# Patient Record
Sex: Female | Born: 1990 | Race: Black or African American | Hispanic: No | Marital: Single | Smoking: Never smoker
Health system: Southern US, Community
[De-identification: ages and names within clinical notes are randomized; demographics above are authoritative.]

## PROBLEM LIST (undated history)

## (undated) DIAGNOSIS — Z9109 Other allergy status, other than to drugs and biological substances: Secondary | ICD-10-CM

## (undated) HISTORY — PX: WISDOM TOOTH EXTRACTION: SHX21

---

## 2017-02-10 LAB — HM PAP SMEAR

## 2017-02-16 ENCOUNTER — Ambulatory Visit (HOSPITAL_COMMUNITY)
Admission: EM | Admit: 2017-02-16 | Discharge: 2017-02-16 | Disposition: A | Discharge status: Home / Self Care | Payer: BLUE CROSS/BLUE SHIELD | Attending: Family Medicine | Admitting: Family Medicine

## 2017-02-16 ENCOUNTER — Other Ambulatory Visit: Payer: Self-pay

## 2017-02-16 ENCOUNTER — Encounter (HOSPITAL_COMMUNITY): Payer: Self-pay | Admitting: Emergency Medicine

## 2017-02-16 DIAGNOSIS — G43909 Migraine, unspecified, not intractable, without status migrainosus: Secondary | ICD-10-CM | POA: Insufficient documentation

## 2017-02-16 DIAGNOSIS — R0981 Nasal congestion: Secondary | ICD-10-CM | POA: Diagnosis not present

## 2017-02-16 DIAGNOSIS — J029 Acute pharyngitis, unspecified: Secondary | ICD-10-CM | POA: Insufficient documentation

## 2017-02-16 DIAGNOSIS — J111 Influenza due to unidentified influenza virus with other respiratory manifestations: Secondary | ICD-10-CM | POA: Insufficient documentation

## 2017-02-16 DIAGNOSIS — R6889 Other general symptoms and signs: Secondary | ICD-10-CM | POA: Diagnosis not present

## 2017-02-16 DIAGNOSIS — R05 Cough: Secondary | ICD-10-CM | POA: Insufficient documentation

## 2017-02-16 LAB — POCT RAPID STREP A: STREPTOCOCCUS, GROUP A SCREEN (DIRECT): NEGATIVE

## 2017-02-16 MED ORDER — ACETAMINOPHEN 325 MG PO TABS
ORAL_TABLET | ORAL | Status: AC
  Filled 2017-02-16: qty 2

## 2017-02-16 MED ORDER — ACETAMINOPHEN 325 MG PO TABS
650.0000 mg | ORAL_TABLET | Freq: Once | ORAL | Status: AC
  Administered 2017-02-16: 650 mg via ORAL

## 2017-02-16 MED ORDER — OSELTAMIVIR PHOSPHATE 75 MG PO CAPS: 75.0000 mg | ORAL_CAPSULE | Freq: Two times a day (BID) | ORAL | 0 refills | Status: DC

## 2017-02-16 NOTE — ED Provider Notes (Signed)
  [facility]   [id number] 02/16/17 Arrival Time: 1034   SUBJECTIVE:  Erin Chapman is a 26 y.o. female who presents to the urgent care with complaint of body aches, migraine, productive cough, sore throat and nasal congestion for one day.  History reviewed. No pertinent past medical history. History reviewed. No pertinent family history. Social History   Socioeconomic History  . Marital status: Single    Spouse name: Not on file  . Number of children: Not on file  . Years of education: Not on file  . Highest education level: Not on file  Social Needs  . Financial resource strain: Not on file  . Food insecurity - worry: Not on file  . Food insecurity - inability: Not on file  . Transportation needs - medical: Not on file  . Transportation needs - non-medical: Not on file  Occupational History  . Not on file  Tobacco Use  . Smoking status: Never Smoker  . Smokeless tobacco: Never Used  Substance and Sexual Activity  . Alcohol use: Yes  . Drug use: No  . Sexual activity: Not on file  Other Topics Concern  . Not on file  Social History Narrative  . Not on file   No outpatient medications have been marked as taking for the 02/16/17 encounter [facility](Hospital Encounter).   No Known Allergies    ROS: As per HPI, remainder of ROS negative.   OBJECTIVE:   Vitals:   02/16/17 1101  BP: (!) 114/59  Pulse: (!) 112  Temp: (!) 102.6 F (39.2 C)  TempSrc: Oral  SpO2: 95%     General appearance: alert; no distress Eyes: PERRL; EOMI; conjunctiva normal HENT: normocephalic; atraumatic; TMs normal, canal normal, external ears normal without trauma; nasal mucosa normal; oral mucosa shows swollen, red left tonsil Neck: supple Lungs: clear to auscultation bilaterally Heart: regular rate and rhythm Back: no CVA tenderness Extremities: no cyanosis or edema; symmetrical with no gross deformities Skin: warm and dry Neurologic: normal gait; grossly  normal Psychological: alert and cooperative; normal mood and affect      Labs:  Results for orders placed or performed during the hospital encounter of 02/16/17  POCT rapid strep A [facility])  Result Value Ref Range   Streptococcus, Group A Screen (Direct) NEGATIVE NEGATIVE    Labs Reviewed  CULTURE, GROUP A STREP [facility])  POCT RAPID STREP A    No results found.     ASSESSMENT & PLAN:  1. Flu-like symptoms     Meds ordered this encounter  Medications  . acetaminophen (TYLENOL) tablet 650 mg  . oseltamivir (TAMIFLU) 75 MG capsule    Sig: Take 1 capsule (75 mg total) by mouth every 12 (twelve) hours.    Dispense:  10 capsule    Refill:  0    Reviewed expectations re: course of current medical issues. Questions answered. Outlined signs and symptoms indicating need for more acute intervention. Patient verbalized understanding. After Visit Summary given.    Procedures:      Elvina SidleLauenstein, Maddelynn Moosman, MD 02/16/17 1131

## 2017-02-16 NOTE — ED Triage Notes (Signed)
Pt reports body aches, migraine, productive cough, sore throat and nasal congestion for two days.

## 2017-02-19 LAB — CULTURE, GROUP A STREP (THRC)

## 2017-07-19 ENCOUNTER — Ambulatory Visit (HOSPITAL_COMMUNITY)
Admission: EM | Admit: 2017-07-19 | Discharge: 2017-07-19 | Disposition: A | Discharge status: Home / Self Care | Payer: BLUE CROSS/BLUE SHIELD | Attending: Family Medicine | Admitting: Family Medicine

## 2017-07-19 ENCOUNTER — Encounter (HOSPITAL_COMMUNITY): Payer: Self-pay | Admitting: Emergency Medicine

## 2017-07-19 DIAGNOSIS — B349 Viral infection, unspecified: Secondary | ICD-10-CM

## 2017-07-19 DIAGNOSIS — R05 Cough: Secondary | ICD-10-CM

## 2017-07-19 DIAGNOSIS — R0981 Nasal congestion: Secondary | ICD-10-CM

## 2017-07-19 MED ORDER — METHYLPREDNISOLONE SODIUM SUCC 125 MG IJ SOLR
125.0000 mg | Freq: Once | INTRAMUSCULAR | Status: AC
  Administered 2017-07-19: 125 mg via INTRAMUSCULAR

## 2017-07-19 MED ORDER — IPRATROPIUM BROMIDE 0.06 % NA SOLN: 2.0000 | Freq: Four times a day (QID) | NASAL | 0 refills | Status: DC

## 2017-07-19 MED ORDER — BENZONATATE 100 MG PO CAPS: 100.0000 mg | ORAL_CAPSULE | Freq: Three times a day (TID) | ORAL | 0 refills | Status: DC

## 2017-07-19 MED ORDER — IPRATROPIUM-ALBUTEROL 0.5-2.5 (3) MG/3ML IN SOLN
3.0000 mL | Freq: Once | RESPIRATORY_TRACT | Status: AC
  Administered 2017-07-19: 3 mL via RESPIRATORY_TRACT

## 2017-07-19 MED ORDER — IPRATROPIUM-ALBUTEROL 0.5-2.5 (3) MG/3ML IN SOLN
RESPIRATORY_TRACT | Status: AC
  Filled 2017-07-19: qty 3

## 2017-07-19 MED ORDER — METHYLPREDNISOLONE SODIUM SUCC 125 MG IJ SOLR
INTRAMUSCULAR | Status: AC
  Filled 2017-07-19: qty 2

## 2017-07-19 MED ORDER — FLUTICASONE PROPIONATE 50 MCG/ACT NA SUSP: 2.0000 | Freq: Every day | NASAL | 0 refills | Status: DC

## 2017-07-19 NOTE — ED Triage Notes (Signed)
PT reports sore throat, SOB, chest tightness, and cough since Saturday. PT has been using inhaler without relief.

## 2017-07-19 NOTE — ED Provider Notes (Signed)
[facility]    CSN: [id number] Arrival date & time: 07/19/17  1359     History   Chief Complaint Chief Complaint  Patient presents with  . URI    HPI Erin Chapman is a 27 y.o. female.   27 year old female comes in with 4-day history of URI symptoms.  She has had sore throat, rhinorrhea, nasal congestion, productive cough.  States cough is worse with moving and talking.  Denies fever, but has felt chills and night sweats.  Denies hemoptysis.  States that she does not have a history of asthma, but has needed inhalers during URI symptoms.  She has been using her albuterol without relief.  OTC cold medication without relief.  Never smoker.     History reviewed. No pertinent past medical history.  There are no active problems to display for this patient.   History reviewed. No pertinent surgical history.  OB History   None      Home Medications    Prior to Admission medications   Medication Sig Start Date End Date Taking? Authorizing Provider  benzonatate (TESSALON) 100 MG capsule Take 1 capsule (100 mg total) by mouth every 8 (eight) hours. 07/19/17   Cathie Hoops, Amy V, PA-C  fluticasone (FLONASE) 50 MCG/ACT nasal spray Place 2 sprays into both nostrils daily. 07/19/17   Cathie Hoops, Amy V, PA-C  ipratropium (ATROVENT) 0.06 % nasal spray Place 2 sprays into both nostrils 4 (four) times daily. 07/19/17   Belinda Fisher, PA-C    Family History No family history on file.  Social History Social History   Tobacco Use  . Smoking status: Never Smoker  . Smokeless tobacco: Never Used  Substance Use Topics  . Alcohol use: Yes  . Drug use: No     Allergies   Patient has no known allergies.   Review of Systems Review of Systems  Reason unable to perform ROS: See HPI as above.     Physical Exam Triage Vital Signs ED Triage Vitals [07/19/17 1449]  Enc Vitals Group     BP 136/85     Pulse Rate 83     Resp 16     Temp 98.9 F (37.2 C)     Temp Source Temporal   SpO2 100 %     Weight 165 lb (74.8 kg)     Height      Head Circumference      Peak Flow      Pain Score 8     Pain Loc      Pain Edu?      Excl. in GC?    No data found.  Updated Vital Signs BP 136/85   Pulse 83   Temp 98.9 F (37.2 C) (Temporal)   Resp 16   Wt 165 lb (74.8 kg)   LMP 07/17/2017   SpO2 100%   Physical Exam  Constitutional: She is oriented to person, place, and time. She appears well-developed and well-nourished. No distress.  HENT:  Head: Normocephalic and atraumatic.  Right Ear: Tympanic membrane, external ear and ear canal normal. Tympanic membrane is not erythematous and not bulging.  Left Ear: Tympanic membrane, external ear and ear canal normal. Tympanic membrane is not erythematous and not bulging.  Nose: Rhinorrhea present. Right sinus exhibits no maxillary sinus tenderness and no frontal sinus tenderness. Left sinus exhibits no maxillary sinus tenderness and no frontal sinus tenderness.  Mouth/Throat: Uvula is midline, oropharynx is clear and moist and mucous membranes are normal.  Eyes:  Pupils are equal, round, and reactive to light. Conjunctivae are normal.  Neck: Normal range of motion. Neck supple.  Cardiovascular: Normal rate, regular rhythm and normal heart sounds. Exam reveals no gallop and no friction rub.  No murmur heard. Pulmonary/Chest: Effort normal and breath sounds normal. She has no decreased breath sounds. She has no wheezes. She has no rhonchi. She has no rales.  Lymphadenopathy:    She has no cervical adenopathy.  Neurological: She is alert and oriented to person, place, and time.  Skin: Skin is warm and dry.  Psychiatric: She has a normal mood and affect. Her behavior is normal. Judgment normal.     UC Treatments / Results  Labs (all labs ordered are listed, but only abnormal results are displayed) Labs Reviewed - No data to display  EKG None  Radiology No results found.  Procedures Procedures (including critical care  time)  Medications Ordered in UC Medications  ipratropium-albuterol (DUONEB) 0.5-2.5 (3) MG/3ML nebulizer solution 3 mL (3 mLs Nebulization Given 07/19/17 1657)  methylPREDNISolone sodium succinate (SOLU-MEDROL) 125 mg/2 mL injection 125 mg (125 mg Intramuscular Given 07/19/17 1718)    Initial Impression / Assessment and Plan / UC Course  I have reviewed the triage vital signs and the nursing notes.  Pertinent labs & imaging results that were available during my care of the patient were reviewed by me and considered in my medical decision making (see chart for details).    Patient with improved symptoms after DuoNeb, lungs continue to be clear to auscultation bilaterally.  Will provide prednisone injection in office today.  Other symptomatic treatment discussed.  Can continue albuterol as needed.  Return precautions given.  Patient expresses understanding and agrees to plan.  Final Clinical Impressions(s) / UC Diagnoses   Final diagnoses:  Viral illness    ED Prescriptions    Medication Sig Dispense Auth. Provider   benzonatate (TESSALON) 100 MG capsule Take 1 capsule (100 mg total) by mouth every 8 (eight) hours. 21 capsule Yu, Amy V, PA-C   fluticasone (FLONASE) 50 MCG/ACT nasal spray Place 2 sprays into both nostrils daily. 1 g Yu, Amy V, PA-C   ipratropium (ATROVENT) 0.06 % nasal spray Place 2 sprays into both nostrils 4 (four) times daily. 15 mL Threasa Alpha, [place] 07/19/17 1807

## 2017-07-19 NOTE — Discharge Instructions (Signed)
Prednisone injection in office today. Tessalon for cough. Continue albuterol as needed for shortness of breathing/wheezing.  Start flonase, atrovent nasal spray for nasal congestion/drainage. You can use over the counter nasal saline rinse such as neti pot for nasal congestion. Keep hydrated, your urine should be clear to pale yellow in color. Tylenol/motrin for fever and pain. Monitor for any worsening of symptoms, chest pain, shortness of breath, wheezing, swelling of the throat, follow up for reevaluation.   For sore throat/cough try using a honey-based tea. Use 3 teaspoons of honey with juice squeezed from half lemon. Place shaved pieces of ginger into 1/2-1 cup of water and warm over stove top. Then mix the ingredients and repeat every 4 hours as needed.

## 2017-09-12 ENCOUNTER — Ambulatory Visit (HOSPITAL_COMMUNITY)
Admission: EM | Admit: 2017-09-12 | Discharge: 2017-09-12 | Disposition: A | Discharge status: Home / Self Care | Payer: Self-pay | Attending: Family Medicine | Admitting: Family Medicine

## 2017-09-12 ENCOUNTER — Encounter (HOSPITAL_COMMUNITY): Payer: Self-pay

## 2017-09-12 DIAGNOSIS — N898 Other specified noninflammatory disorders of vagina: Secondary | ICD-10-CM | POA: Insufficient documentation

## 2017-09-12 DIAGNOSIS — Z79899 Other long term (current) drug therapy: Secondary | ICD-10-CM | POA: Insufficient documentation

## 2017-09-12 LAB — POCT URINALYSIS DIP (DEVICE)
BILIRUBIN URINE: NEGATIVE
Glucose, UA: NEGATIVE mg/dL
Hgb urine dipstick: NEGATIVE
KETONES UR: NEGATIVE mg/dL
Leukocytes, UA: NEGATIVE
NITRITE: NEGATIVE
Protein, ur: NEGATIVE mg/dL
Specific Gravity, Urine: 1.015 (ref 1.005–1.030)
Urobilinogen, UA: 0.2 mg/dL (ref 0.0–1.0)
pH: 6 (ref 5.0–8.0)

## 2017-09-12 MED ORDER — METRONIDAZOLE 500 MG PO TABS: 500.0000 mg | ORAL_TABLET | Freq: Two times a day (BID) | ORAL | 0 refills | Status: AC

## 2017-09-12 MED ORDER — FLUCONAZOLE 150 MG PO TABS: 150.0000 mg | ORAL_TABLET | Freq: Once | ORAL | 0 refills | Status: AC

## 2017-09-12 NOTE — ED Provider Notes (Signed)
[facility]    CSN: [id number] Arrival date & time: 09/12/17  1030     History   Chief Complaint Chief Complaint  Patient presents with  . Vaginal Discharge    HPI Erin Chapman is a 27 y.o. female no significant past medical history presenting today for evaluation of vaginal discharge.  Patient has had vaginal discharge for the past 3 to 4 weeks.  She notes that the discharge is thin and whitish-gray discolored.  Occasional odor.  Is concerned about bacterial vaginosis, denies history of this, but believes this is what she has based off her symptoms.  She states that it may she believes she had a yeast infection and treated this with over-the-counter treatments.  She notes some mild itching and irritation especially when she is wearing tight clothing.  Last menstrual period started Wednesday.  Patient also notes that she has a history of interstitial cystitis, and notes that she did have some mild dysuria, but nothing persistent.  Patient declines concern for STDs.  HPI  History reviewed. No pertinent past medical history.  There are no active problems to display for this patient.   History reviewed. No pertinent surgical history.  OB History   None      Home Medications    Prior to Admission medications   Medication Sig Start Date End Date Taking? Authorizing Provider  benzonatate (TESSALON) 100 MG capsule Take 1 capsule (100 mg total) by mouth every 8 (eight) hours. 07/19/17   Cathie HoopsYu, Amy V, PA-C  fluconazole (DIFLUCAN) 150 MG tablet Take 1 tablet (150 mg total) by mouth once for 1 dose. 09/12/17 09/12/17  Natalyia Innes C, PA-C  fluticasone (FLONASE) 50 MCG/ACT nasal spray Place 2 sprays into both nostrils daily. 07/19/17   Cathie HoopsYu, Amy V, PA-C  ipratropium (ATROVENT) 0.06 % nasal spray Place 2 sprays into both nostrils 4 (four) times daily. 07/19/17   Cathie HoopsYu, Amy V, PA-C  metroNIDAZOLE (FLAGYL) 500 MG tablet Take 1 tablet (500 mg total) by mouth 2 (two) times daily for 7  days. 09/12/17 09/19/17  Deigo Alonso, Junius CreamerHallie C, PA-C    Family History Family History  Problem Relation Age of Onset  . Healthy Mother   . Healthy Father     Social History Social History   Tobacco Use  . Smoking status: Never Smoker  . Smokeless tobacco: Never Used  Substance Use Topics  . Alcohol use: Yes  . Drug use: No     Allergies   Patient has no known allergies.   Review of Systems Review of Systems  Constitutional: Negative for fever.  Respiratory: Negative for shortness of breath.   Cardiovascular: Negative for chest pain.  Gastrointestinal: Negative for abdominal pain, diarrhea, nausea and vomiting.  Genitourinary: Positive for dysuria and vaginal discharge. Negative for flank pain, frequency, genital sores, hematuria, menstrual problem, urgency, vaginal bleeding and vaginal pain.  Musculoskeletal: Negative for back pain.  Skin: Negative for rash.  Neurological: Negative for dizziness, light-headedness and headaches.     Physical Exam Triage Vital Signs ED Triage Vitals  Enc Vitals Group     BP 09/12/17 1049 121/78     Pulse Rate 09/12/17 1049 68     Resp 09/12/17 1049 16     Temp 09/12/17 1049 98.4 F (36.9 C)     Temp Source 09/12/17 1049 Oral     SpO2 09/12/17 1049 100 %     Weight --      Height --      Head  Circumference --      Peak Flow --      Pain Score 09/12/17 1053 0     Pain Loc --      Pain Edu? --      Excl. in GC? --    No data found.  Updated Vital Signs BP 121/78 (BP Location: Right Arm)   Pulse 68   Temp 98.4 F (36.9 C) (Oral)   Resp 16   LMP 09/12/2017   SpO2 100%   Visual Acuity Right Eye Distance:   Left Eye Distance:   Bilateral Distance:    Right Eye Near:   Left Eye Near:    Bilateral Near:     Physical Exam  Constitutional: She is oriented to person, place, and time. She appears well-developed and well-nourished.  No acute distress  HENT:  Head: Normocephalic and atraumatic.  Nose: Nose normal.  Eyes:  Conjunctivae are normal.  Neck: Neck supple.  Cardiovascular: Normal rate.  Pulmonary/Chest: Effort normal. No respiratory distress.  Abdominal: She exhibits no distension. There is tenderness.  Nontender light deep palpation throughout all 4 quadrants and epigastrium  Genitourinary:  Genitourinary Comments: Normal female external genitalia, mild amount of bright red blood in the vagina, no cervical erythema  Musculoskeletal: Normal range of motion.  Neurological: She is alert and oriented to person, place, and time.  Skin: Skin is warm and dry.  Psychiatric: She has a normal mood and affect.  Nursing note and vitals reviewed.    UC Treatments / Results  Labs (all labs ordered are listed, but only abnormal results are displayed) Labs Reviewed  POCT URINALYSIS DIP (DEVICE)  CERVICOVAGINAL ANCILLARY ONLY    EKG None  Radiology No results found.  Procedures Procedures (including critical care time)  Medications Ordered in UC Medications - No data to display  Initial Impression / Assessment and Plan / UC Course  I have reviewed the triage vital signs and the nursing notes.  Pertinent labs & imaging results that were available during my care of the patient were reviewed by me and considered in my medical decision making (see chart for details).     Patient with vaginal discharge, vaginal swab obtained, will go ahead and initiate treatment for yeast and BV given symptoms.  We will also check for STDs.  Will defer empiric treatment today for STDs.  Will call patient with results and alter treatment as needed.Discussed strict return precautions. Patient verbalized understanding and is agreeable with plan.  Final Clinical Impressions(s) / UC Diagnoses   Final diagnoses:  Vaginal discharge     Discharge Instructions     Please begin taking metronidazole twice daily for the next week.  This will treat bacterial vaginosis.  Please do not drink alcohol while taking this  medicine.  We are testing you for Gonorrhea, Chlamydia, Trichomonas, Yeast and Bacterial Vaginosis. We will call you if anything is positive and let you know if you require any further treatment. Please inform partners of any positive results.   Please return if symptoms not improving with treatment, development of fever, nausea, vomiting, abdominal pain.    ED Prescriptions    Medication Sig Dispense Auth. Provider   metroNIDAZOLE (FLAGYL) 500 MG tablet Take 1 tablet (500 mg total) by mouth 2 (two) times daily for 7 days. 14 tablet Margalit Leece C, PA-C   fluconazole (DIFLUCAN) 150 MG tablet Take 1 tablet (150 mg total) by mouth once for 1 dose. 2 tablet Lyn Deemer, [place] C, PA-C  Controlled Substance Prescriptions [place] Controlled Substance Registry consulted? Not Applicable   Lew Dawes, [place] 09/12/17 1222

## 2017-09-12 NOTE — ED Triage Notes (Signed)
Pt presents with vaginal  discharge 

## 2017-09-12 NOTE — Discharge Instructions (Signed)
Please begin taking metronidazole twice daily for the next week.  This will treat bacterial vaginosis.  Please do not drink alcohol while taking this medicine.  We are testing you for Gonorrhea, Chlamydia, Trichomonas, Yeast and Bacterial Vaginosis. We will call you if anything is positive and let you know if you require any further treatment. Please inform partners of any positive results.   Please return if symptoms not improving with treatment, development of fever, nausea, vomiting, abdominal pain.

## 2017-09-13 LAB — CERVICOVAGINAL ANCILLARY ONLY
Bacterial vaginitis: NEGATIVE
Candida vaginitis: NEGATIVE
Chlamydia: NEGATIVE
NEISSERIA GONORRHEA: NEGATIVE
TRICH (WINDOWPATH): NEGATIVE

## 2018-01-15 ENCOUNTER — Other Ambulatory Visit: Payer: Self-pay

## 2018-01-15 ENCOUNTER — Encounter (HOSPITAL_COMMUNITY): Payer: Self-pay | Admitting: *Deleted

## 2018-01-15 ENCOUNTER — Ambulatory Visit (HOSPITAL_COMMUNITY)
Admission: EM | Admit: 2018-01-15 | Discharge: 2018-01-15 | Disposition: A | Discharge status: Home / Self Care | Payer: Self-pay | Attending: Family Medicine | Admitting: Family Medicine

## 2018-01-15 DIAGNOSIS — Z9109 Other allergy status, other than to drugs and biological substances: Secondary | ICD-10-CM

## 2018-01-15 HISTORY — DX: Other allergy status, other than to drugs and biological substances: Z91.09

## 2018-01-15 LAB — POCT URINALYSIS DIP (DEVICE)
BILIRUBIN URINE: NEGATIVE
Glucose, UA: NEGATIVE mg/dL
Ketones, ur: NEGATIVE mg/dL
Leukocytes, UA: NEGATIVE
Nitrite: NEGATIVE
PH: 5.5 (ref 5.0–8.0)
PROTEIN: NEGATIVE mg/dL
Specific Gravity, Urine: >=1.03 (ref 1.005–1.030)
Urobilinogen, UA: 0.2 mg/dL (ref 0.0–1.0)

## 2018-01-15 LAB — POCT I-STAT, CHEM 8
BUN: 5 mg/dL — AB (ref 6–20)
CALCIUM ION: 1.11 mmol/L — AB (ref 1.15–1.40)
CREATININE: 0.8 mg/dL (ref 0.44–1.00)
Chloride: 105 mmol/L (ref 98–111)
GLUCOSE: 93 mg/dL (ref 70–99)
HEMATOCRIT: 32 % — AB (ref 36.0–46.0)
Hemoglobin: 10.9 g/dL — ABNORMAL LOW (ref 12.0–15.0)
Potassium: 4 mmol/L (ref 3.5–5.1)
Sodium: 139 mmol/L (ref 135–145)
TCO2: 25 mmol/L (ref 22–32)

## 2018-01-15 MED ORDER — METHYLPREDNISOLONE SODIUM SUCC 125 MG IJ SOLR
80.0000 mg | Freq: Once | INTRAMUSCULAR | Status: AC
  Administered 2018-01-15: 80 mg via INTRAMUSCULAR

## 2018-01-15 MED ORDER — METHYLPREDNISOLONE SODIUM SUCC 125 MG IJ SOLR
INTRAMUSCULAR | Status: AC
  Filled 2018-01-15: qty 2

## 2018-01-15 NOTE — ED Triage Notes (Signed)
Reports waking yesterday morning with bilat hand swelling; took Benadryl.  Today feels her entire body is swollen.  Denies rash.

## 2018-01-15 NOTE — Discharge Instructions (Signed)
Continue allergy medicine Avoid salt See your PCP in follow up

## 2018-01-15 NOTE — ED Provider Notes (Signed)
[facility]    CSN: [id number] Arrival date & time: 01/15/18  1021     History   Chief Complaint Chief Complaint  Patient presents with  . Edema    HPI Erin Chapman is a 27 y.o. female.   HPI  Patient is here for body swelling.  Started yesterday.  She took Benadryl.  This morning it was worse.  She came into be evaluated. She has multiple environmental allergies.  She states she is allergic to hay, grass, molds, "everything outside".  She takes Zyrtec daily and Flonase.  This usually controls her symptoms.  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  She does not remember anything out of the usual.  No other allergies, foods, medicines, supplements. She does not have a joint pain.  She states it feels more like a "puffiness" "stiffness".  It is moderately uncomfortable.  It affects her ability to grip. She denies eating anything different or a salt load She does not have any hormonal problems, irregular periods, thyroid disease, kidney disease She states she feels like she got dehydrated a couple days before her edema, drink a lot of fluid to catch up, and then was puffy the next day No shortness of breath No abdominal distention No chest pain or pressure No underlying medical problems except for her allergies  Past Medical History:  Diagnosis Date  . Environmental allergies     There are no active problems to display for this patient.   History reviewed. No pertinent surgical history.  OB History   None      Home Medications    Prior to Admission medications   Not on File    Family History Family History  Problem Relation Age of Onset  . Healthy Mother   . Healthy Father     Social History Social History   Tobacco Use  . Smoking status: Never Smoker  . Smokeless tobacco: Never Used  Substance Use Topics  . Alcohol use: Yes    Comment: occasionally  . Drug use: No     Allergies   Patient has  no known allergies.   Review of Systems Review of Systems  Constitutional: Negative for chills and fever.  HENT: Negative for ear pain and sore throat.   Eyes: Negative for pain and visual disturbance.  Respiratory: Negative for cough and shortness of breath.   Cardiovascular: Positive for leg swelling. Negative for chest pain and palpitations.       Hands ankles feet all feel "puffy"  Gastrointestinal: Negative for abdominal pain and vomiting.  Genitourinary: Negative for dysuria and hematuria.  Musculoskeletal: Negative for arthralgias and back pain.  Skin: Negative for color change and rash.  Neurological: Negative for seizures and syncope.  All other systems reviewed and are negative.    Physical Exam Triage Vital Signs ED Triage Vitals  Enc Vitals Group     BP 01/15/18 1127 (!) 142/88     Pulse Rate 01/15/18 1127 62     Resp 01/15/18 1127 16     Temp 01/15/18 1127 98.1 F (36.7 C)     Temp Source 01/15/18 1127 Oral     SpO2 01/15/18 1127 100 %   No data found.  Updated Vital Signs BP (!) 142/88   Pulse 62   Temp 98.1 F (36.7 C) (Oral)   Resp 16   LMP 01/11/2018 (Exact Date)   SpO2 100%    Physical Exam  Constitutional:  She appears well-developed and well-nourished. No distress.  HENT:  Head: Normocephalic and atraumatic.  Mouth/Throat: Oropharynx is clear and moist.  Eyes: Pupils are equal, round, and reactive to light. Conjunctivae are normal.  Neck: Normal range of motion. Neck supple. No thyromegaly present.  Cardiovascular: Normal rate, regular rhythm and normal heart sounds.  Pulmonary/Chest: Effort normal and breath sounds normal. No respiratory distress.  Abdominal: Soft. She exhibits no distension. There is no tenderness.  No HSM  Musculoskeletal: Normal range of motion. She exhibits edema.  Trace pitting edema to the ankle  Neurological: She is alert.  Skin: Skin is warm and dry. No rash noted.  Psychiatric: She has a normal mood and affect.  Her behavior is normal.     UC Treatments / Results  Labs (all labs ordered are listed, but only abnormal results are displayed) Labs Reviewed  POCT I-STAT, CHEM 8 - Abnormal; Notable for the following components:      Result Value   BUN 5 (*)    Calcium, Ion 1.11 (*)    Hemoglobin 10.9 (*)    HCT 32.0 (*)    All other components within normal limits  POCT URINALYSIS DIP (DEVICE) - Abnormal; Notable for the following components:   Hgb urine dipstick LARGE (*)    All other components within normal limits    EKG None  Radiology No results found.  Procedures Procedures (including critical care time)  Medications Ordered in UC Medications  methylPREDNISolone sodium succinate (SOLU-MEDROL) 125 mg/2 mL injection 80 mg (80 mg Intramuscular Given 01/15/18 1303)    Initial Impression / Assessment and Plan / UC Course  I have reviewed the triage vital signs and the nursing notes.  Pertinent labs & imaging results that were available during my care of the patient were reviewed by me and considered in my medical decision making (see chart for details).     I reviewed with the patient and her friend there are multiple causes of edema.  Can be inflammatory.  Could be allergic.  Could be from metabolic disease.  Could be from salt or dietary intake.  I told her that we are going to treat her for allergies since it seems most likely.  She needs follow-up with the PCP if she needs additional testing. Final Clinical Impressions(s) / UC Diagnoses   Final diagnoses:  Multiple environmental allergies     Discharge Instructions     Continue allergy medicine Avoid salt See your PCP in follow up    ED Prescriptions    None     Controlled Substance Prescriptions [place] Controlled Substance Registry consulted? Not Applicable   Eustace Moore, MD 01/15/18 [phone]

## 2018-10-16 ENCOUNTER — Encounter (HOSPITAL_COMMUNITY): Payer: Self-pay | Admitting: Emergency Medicine

## 2018-10-16 ENCOUNTER — Emergency Department (HOSPITAL_COMMUNITY): Payer: No Typology Code available for payment source

## 2018-10-16 ENCOUNTER — Other Ambulatory Visit: Payer: Self-pay

## 2018-10-16 ENCOUNTER — Emergency Department (HOSPITAL_COMMUNITY)
Admission: EM | Admit: 2018-10-16 | Discharge: 2018-10-16 | Disposition: A | Discharge status: Home / Self Care | Payer: No Typology Code available for payment source | Attending: Emergency Medicine | Admitting: Emergency Medicine

## 2018-10-16 DIAGNOSIS — Y998 Other external cause status: Secondary | ICD-10-CM | POA: Insufficient documentation

## 2018-10-16 DIAGNOSIS — W2210XA Striking against or struck by unspecified automobile airbag, initial encounter: Secondary | ICD-10-CM | POA: Diagnosis not present

## 2018-10-16 DIAGNOSIS — R51 Headache: Secondary | ICD-10-CM | POA: Diagnosis present

## 2018-10-16 DIAGNOSIS — Y92413 State road as the place of occurrence of the external cause: Secondary | ICD-10-CM | POA: Diagnosis not present

## 2018-10-16 DIAGNOSIS — R200 Anesthesia of skin: Secondary | ICD-10-CM | POA: Insufficient documentation

## 2018-10-16 DIAGNOSIS — M546 Pain in thoracic spine: Secondary | ICD-10-CM | POA: Diagnosis not present

## 2018-10-16 DIAGNOSIS — Y9389 Activity, other specified: Secondary | ICD-10-CM | POA: Diagnosis not present

## 2018-10-16 MED ORDER — BACLOFEN 10 MG PO TABS: 10.0000 mg | ORAL_TABLET | Freq: Three times a day (TID) | ORAL | 0 refills | Status: DC

## 2018-10-16 MED ORDER — MELOXICAM 15 MG PO TABS: 15.0000 mg | ORAL_TABLET | Freq: Every day | ORAL | 0 refills | Status: DC
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
## 2018-10-16 NOTE — ED Notes (Signed)
Patient transported to CT 
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
## 2018-10-16 NOTE — Discharge Instructions (Addendum)

## 2019-04-24 ENCOUNTER — Ambulatory Visit: Payer: Self-pay | Admitting: Internal Medicine

## 2019-05-21 ENCOUNTER — Ambulatory Visit: Payer: Self-pay | Admitting: Internal Medicine

## 2019-08-07 ENCOUNTER — Ambulatory Visit: Payer: Self-pay | Admitting: Internal Medicine
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
## 2022-03-09 ENCOUNTER — Other Ambulatory Visit (HOSPITAL_COMMUNITY): Payer: Self-pay

## 2022-05-20 ENCOUNTER — Ambulatory Visit: Payer: 59 | Admitting: Family

## 2022-05-20 ENCOUNTER — Other Ambulatory Visit (HOSPITAL_COMMUNITY): Payer: Self-pay

## 2022-05-20 ENCOUNTER — Encounter: Payer: Self-pay | Admitting: Family

## 2022-05-20 VITALS — BP 128/79 | HR 79 | Temp 97.8°F | Ht 64.0 in | Wt 164.1 lb

## 2022-05-20 DIAGNOSIS — R519 Headache, unspecified: Secondary | ICD-10-CM

## 2022-05-20 DIAGNOSIS — F411 Generalized anxiety disorder: Secondary | ICD-10-CM

## 2022-05-20 DIAGNOSIS — K219 Gastro-esophageal reflux disease without esophagitis: Secondary | ICD-10-CM | POA: Diagnosis not present

## 2022-05-20 DIAGNOSIS — N3010 Interstitial cystitis (chronic) without hematuria: Secondary | ICD-10-CM | POA: Diagnosis not present

## 2022-05-20 MED ORDER — AMITRIPTYLINE HCL 25 MG PO TABS
25.0000 mg | ORAL_TABLET | Freq: Every day | ORAL | 0 refills | Status: DC
  Filled 2022-07-05: qty 30, 30d supply, fill #0

## 2022-05-20 MED ORDER — VENLAFAXINE HCL ER 37.5 MG PO CP24: 37.5000 mg | ORAL_CAPSULE | Freq: Every day | ORAL | 1 refills | Status: DC

## 2022-05-20 NOTE — Progress Notes (Signed)
+  New Patient Office Visit  Subjective:  Patient ID: Erin Chapman, female    DOB: 04/18/1990  Age: 32 y.o. MRN: [id number]  CC:  Chief Complaint  Patient presents with   New Patient (Initial Visit)   Migraine    Pt c/o migraines, Present for a couple of months and has been getting worst. Has tried advil, goody powder which does not help.   Gastroesophageal Reflux    Pt c/o GERD for a year, Has tried Tums which does not help but did in the past.    Anxiety    Amitriptyline in the past which did help sx.    HPI Raelinn Giampa presents for establishing care today.  GERD:  c/o sour stomach, epigastric pain, belching, feels like she almost regurgitates. She has been taking Tums   Anxiety:  over last year has worsened; pt working a new job, recently moved back to the area. Denies any depression, no hx of therapy or any medications.   Persistent headaches/pressure:  also increased over the last year. No hx of any therapy or medications in past, was taking Amitriptyline for IC but stopped a little over a year ago as she was doing better with her sx.  Interstitial cystitis:  had been taking Elavil in past which controlled her sx, but stopped over a year ago as she was doing better, now starting to have sx again, along with increased stress which usually brings on her sx.  Assessment & Plan:  Interstitial cystitis Assessment & Plan: chronic used to take Elavil, stopped over a yr ago, sx were better now having more stress which is aggravating sx again wants to restart Elavil sending 25mg  qhs f/u 76mos or prn  Orders: -     Amitriptyline HCl; Take 1 tablet (25 mg total) by mouth at bedtime.  Dispense: 90 tablet; Refill: 0  Generalized anxiety disorder Assessment & Plan: New moved back recently, working new job discussed meds and therapy sending therapy referral, advised pt to call & sch appt sending Effexor 37.5mg  may also help with her headaches, advised on use & SE f/u 1  month  Orders: -     Venlafaxine HCl ER; Take 1 capsule (37.5 mg total) by mouth daily with breakfast.  Dispense: 30 capsule; Refill: 1  Gastroesophageal reflux disease without esophagitis Assessment & Plan: New Advised on low acid diet ok to continue Tums as needed can try OTC generic Pepcid qd reduce anxiety as much as possible f/u prn   Persistent headaches- most likely d/t increased stress/anxiety, starting Effexor which hopefully will help HA & anxiety. Advised pt to try Excedrin migraine OTC, avoid using ASA, Ibuprofen/Advil qd.F/U in 1 month.  Subjective:    Outpatient Medications Prior to Visit  Medication Sig Dispense Refill   baclofen (LIORESAL) 10 MG tablet Take 1 tablet (10 mg total) by mouth 3 (three) times daily. 30 each 0   meloxicam (MOBIC) 15 MG tablet Take 1 tablet (15 mg total) by mouth daily. Take 1 daily with food. 10 tablet 0   No facility-administered medications prior to visit.   Past Medical History:  Diagnosis Date   Environmental allergies    Past Surgical History:  Procedure Laterality Date   WISDOM TOOTH EXTRACTION      Objective:   Today's Vitals: BP 128/79 (BP Location: Left Arm, Patient Position: Sitting, Cuff Size: Large)   Pulse 79   Temp 97.8 F (36.6 C) (Temporal)   Ht 5\' 4"  (1.626 m)   Wt  164 lb 2 oz (74.4 kg)   LMP 05/12/2022 (Approximate)   SpO2 97%   BMI 28.17 kg/m   Physical Exam Vitals and nursing note reviewed.  Constitutional:      Appearance: Normal appearance.  Cardiovascular:     Rate and Rhythm: Normal rate and regular rhythm.  Pulmonary:     Effort: Pulmonary effort is normal.     Breath sounds: Normal breath sounds.  Musculoskeletal:        General: Normal range of motion.  Skin:    General: Skin is warm and dry.  Neurological:     Mental Status: She is alert.  Psychiatric:        Mood and Affect: Mood normal.        Behavior: Behavior normal.    Meds ordered this encounter  Medications    amitriptyline (ELAVIL) 25 MG tablet    Sig: Take 1 tablet (25 mg total) by mouth at bedtime.    Dispense:  90 tablet    Refill:  0    Order Specific Question:   Supervising Provider    Answer:   ANDY, CAMILLE L [[id number]]   venlafaxine XR (EFFEXOR XR) 37.5 MG 24 hr capsule    Sig: Take 1 capsule (37.5 mg total) by mouth daily with breakfast.    Dispense:  30 capsule    Refill:  1    Order Specific Question:   Supervising Provider    Answer:   ANDY, CAMILLE L [[id number]]    Jeanie Sewer, NP

## 2022-05-20 NOTE — Patient Instructions (Addendum)
Welcome to [facility], It was a pleasure meeting you today!    As discussed, I have sent your refill of Amitriptyline and the new medication, Effexor to your pharmacy. Restart the Amitriptyline at bedtime for 1 week, then start the Effexor in the mornings.  If you feel side effects like mental status changes (confusion/agitation), high blood pressure, increased heart rate, vomiting, diarrhea, or increased twitching/jerking movements. Stop the Effexor & let me know, but these are very rare.  I have sent a referral to our behavioral health office, but you have to call them to schedule - [phone], they will not call you.  Please schedule a 1 month follow up visit today.    PLEASE NOTE: If you had any LAB tests please let us know if you have not heard back within a few days. You may see your results on MyChart before we have a chance to review them but we will give you a call once they are reviewed by [place]. If we ordered any REFERRALS today, please let us know if you have not heard from their office within the next week.  Let us know through MyChart if you are needing REFILLS, or have your pharmacy send [place] the request. You can also use MyChart to communicate with me or any office staff.  Please try these tips to maintain a healthy lifestyle: It is important that you exercise regularly at least 30 minutes 5 times a week. Think about what you will eat, plan ahead. Choose whole foods, & think  "clean, green, fresh or frozen" over canned, processed or packaged foods which are more sugary, salty, and fatty. 70 to 75% of food eaten should be fresh vegetables and protein. 2-3  meals daily with healthy snacks between meals, but must be whole fruit, protein or vegetables. Aim to eat over a 10 hour period when you are active, for example, 7am to 5pm, and then STOP after your last meal of the day, drinking only water.  Shorter eating windows, 6-8 hours, are showing  benefits in heart disease and blood sugar regulation. Drink water every day! Shoot for 64 ounces daily = 8 cups, no other drink is as healthy! Fruit juice is best enjoyed in a healthy way, by EATING the fruit.

## 2022-05-20 NOTE — Assessment & Plan Note (Signed)
New Advised on low acid diet ok to continue Tums as needed can try OTC generic Pepcid qd reduce anxiety as much as possible f/u prn

## 2022-05-20 NOTE — Assessment & Plan Note (Signed)
chronic used to take Elavil, stopped over a yr ago, sx were better now having more stress which is aggravating sx again wants to restart Elavil sending 25mg  qhs f/u 43mos or prn

## 2022-05-20 NOTE — Progress Notes (Deleted)
Phone [phone]  Subjective:   Patient is a 32 y.o. female presenting for annual physical.    No chief complaint on file.   See problem oriented charting- ROS- full  review of systems was completed and negative except for *** noted in HPI above.  The following were reviewed and entered/updated in epic: Past Medical History:  Diagnosis Date   Environmental allergies    There are no problems to display for this patient.  No past surgical history on file.  Family History  Problem Relation Age of Onset   Healthy Mother    Healthy Father     Medications- reviewed and updated Current Outpatient Medications  Medication Sig Dispense Refill   baclofen (LIORESAL) 10 MG tablet Take 1 tablet (10 mg total) by mouth 3 (three) times daily. 30 each 0   meloxicam (MOBIC) 15 MG tablet Take 1 tablet (15 mg total) by mouth daily. Take 1 daily with food. 10 tablet 0   No current facility-administered medications for this visit.    Allergies-reviewed and updated No Known Allergies  Social History   Social History Narrative   Not on file    Objective:  There were no vitals taken for this visit. Physical Exam Vitals and nursing note reviewed.  Constitutional:      Appearance: Normal appearance.  HENT:     Head: Normocephalic.     Right Ear: Tympanic membrane normal.     Left Ear: Tympanic membrane normal.     Nose: Nose normal.     Mouth/Throat:     Mouth: Mucous membranes are moist.  Eyes:     Pupils: Pupils are equal, round, and reactive to light.  Cardiovascular:     Rate and Rhythm: Normal rate and regular rhythm.  Pulmonary:     Effort: Pulmonary effort is normal.     Breath sounds: Normal breath sounds.  Musculoskeletal:        General: Normal range of motion.     Cervical back: Normal range of motion.  Lymphadenopathy:     Cervical: No cervical adenopathy.  Skin:    General: Skin is warm and dry.  Neurological:     Mental Status: She is alert.   Psychiatric:        Mood and Affect: Mood normal.        Behavior: Behavior normal.       Assessment and Plan   Health Maintenance counseling: 1. Anticipatory guidance: Patient counseled regarding regular dental exams q6 months, eye exams,  avoiding smoking and second hand smoke, limiting alcohol to 1 beverage per day, no illicit drugs.   2. Risk factor reduction:  Advised patient of need for regular exercise and diet rich with fruits and vegetables to reduce risk of heart attack and stroke. Wt Readings from Last 3 Encounters:  07/19/17 165 lb (74.8 kg)   3. Immunizations/screenings/ancillary studies  There is no immunization history on file for this patient. Health Maintenance Due  Topic Date Due   COVID-19 Vaccine (1) Never done   HIV Screening  Never done   Hepatitis C Screening  Never done   DTaP/Tdap/Td (1 - Tdap) Never done   PAP SMEAR-Modifier  Never done   INFLUENZA VACCINE  Never done    4. Cervical cancer screening: *** 5. Skin cancer screening- advised regular sunscreen use. Denies worrisome, changing, or new skin lesions.  6. Birth control/STD check: *** 7. Smoking associated screening: *** smoker 8. Alcohol screening: ***  There are no diagnoses linked  to this encounter.  Recommended follow up: ***No follow-ups on file. Future Appointments  Date Time Provider [place]  05/20/2022  8:20 AM Jeanie Sewer, NP [facility]    Lab/Order associations:fasting    Jeanie Sewer, NP

## 2022-05-20 NOTE — Assessment & Plan Note (Signed)
New moved back recently, working new job discussed meds and therapy sending therapy referral, advised pt to call & sch appt sending Effexor 37.5mg  may also help with her headaches, advised on use & SE f/u 1 month

## 2022-07-05 ENCOUNTER — Other Ambulatory Visit (HOSPITAL_COMMUNITY): Payer: Self-pay

## 2022-07-05 ENCOUNTER — Other Ambulatory Visit: Payer: Self-pay

## 2022-07-05 MED ORDER — VENLAFAXINE HCL ER 37.5 MG PO CP24
37.5000 mg | ORAL_CAPSULE | Freq: Every day | ORAL | 0 refills | Status: DC
  Filled 2022-07-05: qty 30, 30d supply, fill #0

## 2022-07-27 ENCOUNTER — Encounter: Payer: Self-pay | Admitting: Family

## 2022-07-27 ENCOUNTER — Telehealth (INDEPENDENT_AMBULATORY_CARE_PROVIDER_SITE_OTHER): Payer: 59 | Admitting: Family

## 2022-07-27 ENCOUNTER — Other Ambulatory Visit: Payer: Self-pay

## 2022-07-27 ENCOUNTER — Other Ambulatory Visit (HOSPITAL_BASED_OUTPATIENT_CLINIC_OR_DEPARTMENT_OTHER): Payer: Self-pay

## 2022-07-27 DIAGNOSIS — F411 Generalized anxiety disorder: Secondary | ICD-10-CM | POA: Diagnosis not present

## 2022-07-27 MED ORDER — BUSPIRONE HCL 5 MG PO TABS
5.0000 mg | ORAL_TABLET | Freq: Two times a day (BID) | ORAL | 2 refills | Status: DC
  Filled 2022-07-27 (×2): qty 60, 30d supply, fill #0
  Filled 2022-09-20: qty 60, 30d supply, fill #1

## 2022-07-27 MED ORDER — BUSPIRONE HCL 5 MG PO TABS
5.0000 mg | ORAL_TABLET | Freq: Two times a day (BID) | ORAL | 2 refills | Status: DC
  Filled 2022-07-27: qty 60, 30d supply, fill #0

## 2022-07-27 NOTE — Progress Notes (Signed)
MyChart Video Visit    Virtual Visit via Video Note   This format is felt to be most appropriate for this patient at this time. Physical exam was limited by quality of the video and audio technology used for the visit. CMA was able to get the patient set up on a video visit.  Patient location: Home. Patient and provider in visit Provider location: Office  I discussed the limitations of evaluation and management by telemedicine and the availability of in person appointments. The patient expressed understanding and agreed to proceed.  Visit Date: 07/27/2022  Today's healthcare provider: Dulce Sellar, NP     Subjective:   Patient ID: Erin Chapman, female    DOB: [birth year], 31 y.o.   MRN: [id number]  Chief Complaint  Patient presents with   Anxiety    Experiencing side effects from Effexor - decreased sex drive  Wanting to discuss options of PRN medication vs daily medications     HPI Anxiety:  over last year has worsened; pt working a new job, recently moved back to the area. Denies any depression, no hx of therapy or any medications. Last visit started on Effexor, but states this has reduced her sex drive.  Assessment & Plan:  Generalized anxiety disorder Assessment & Plan: New moved back recently, working new job taking Effexor 37.5mg  but says after work she is very tired, also she feels it has reduced her sex drive switching to Buspar, advised on use & SE, stop Effexor f/u 3 month  Orders: -     busPIRone HCl; Take 1 tablet (5 mg total) by mouth 2 (two) times daily. START with 1 pill qam x 1w, then increase to 2 pills qam, or 1 bill bid.  Dispense: 60 tablet; Refill: 2    Past Medical History:  Diagnosis Date   Environmental allergies     Past Surgical History:  Procedure Laterality Date   WISDOM TOOTH EXTRACTION      Outpatient Medications Prior to Visit  Medication Sig Dispense Refill   amitriptyline (ELAVIL) 25 MG tablet Take 1 tablet (25  mg total) by mouth at bedtime. 90 tablet 0   venlafaxine XR (EFFEXOR XR) 37.5 MG 24 hr capsule Take 1 capsule (37.5 mg total) by mouth daily with breakfast. 30 capsule 1   venlafaxine XR (EFFEXOR-XR) 37.5 MG 24 hr capsule Take 1 capsule (37.5 mg total) by mouth daily with breakfast. 30 capsule 0   No facility-administered medications prior to visit.    No Known Allergies     Objective:   Physical Exam Vitals and nursing note reviewed.  Constitutional:      General: Pt is not in acute distress.    Appearance: Normal appearance.  HENT:     Head: Normocephalic.  Pulmonary:     Effort: No respiratory distress.  Musculoskeletal:     Cervical back: Normal range of motion.  Skin:    General: Skin is dry.     Coloration: Skin is not pale.  Neurological:     Mental Status: Pt is alert and oriented to person, place, and time.  Psychiatric:        Mood and Affect: Mood normal.   There were no vitals taken for this visit.  Wt Readings from Last 3 Encounters:  05/20/22 164 lb 2 oz (74.4 kg)  07/19/17 165 lb (74.8 kg)        I discussed the assessment and treatment plan with the patient. The patient was provided an opportunity  to ask questions and all were answered. The patient agreed with the plan and demonstrated an understanding of the instructions.   The patient was advised to call back or seek an in-person evaluation if the symptoms worsen or if the condition fails to improve as anticipated.  Dulce Sellar, NP [facility] [place] [phone] (phone) [phone] (fax)  [facility]

## 2022-07-27 NOTE — Assessment & Plan Note (Signed)
New moved back recently, working new job taking Effexor 37.5mg  but says after work she is very tired, also she feels it has reduced her sex drive switching to Buspar, advised on use & SE, stop Effexor f/u 3 month

## 2022-08-04 ENCOUNTER — Other Ambulatory Visit: Payer: Self-pay

## 2022-09-20 ENCOUNTER — Other Ambulatory Visit: Payer: Self-pay

## 2022-11-18 ENCOUNTER — Other Ambulatory Visit (HOSPITAL_BASED_OUTPATIENT_CLINIC_OR_DEPARTMENT_OTHER): Payer: Self-pay

## 2022-11-18 MED ORDER — INFLUENZA VIRUS VACC SPLIT PF (FLUZONE) 0.5 ML IM SUSY: 0.5000 mL | PREFILLED_SYRINGE | Freq: Once | INTRAMUSCULAR | 0 refills | Status: AC

## 2022-11-19 ENCOUNTER — Other Ambulatory Visit: Payer: Self-pay

## 2022-11-19 ENCOUNTER — Other Ambulatory Visit (HOSPITAL_COMMUNITY): Payer: Self-pay

## 2022-11-19 ENCOUNTER — Ambulatory Visit (INDEPENDENT_AMBULATORY_CARE_PROVIDER_SITE_OTHER): Payer: 59 | Admitting: Family

## 2022-11-19 ENCOUNTER — Encounter: Payer: Self-pay | Admitting: Family

## 2022-11-19 VITALS — BP 128/84 | HR 81 | Temp 97.3°F | Ht 64.0 in | Wt 173.4 lb

## 2022-11-19 DIAGNOSIS — N926 Irregular menstruation, unspecified: Secondary | ICD-10-CM | POA: Insufficient documentation

## 2022-11-19 DIAGNOSIS — Z23 Encounter for immunization: Secondary | ICD-10-CM

## 2022-11-19 DIAGNOSIS — F411 Generalized anxiety disorder: Secondary | ICD-10-CM | POA: Diagnosis not present

## 2022-11-19 DIAGNOSIS — M79671 Pain in right foot: Secondary | ICD-10-CM

## 2022-11-19 DIAGNOSIS — N3010 Interstitial cystitis (chronic) without hematuria: Secondary | ICD-10-CM

## 2022-11-19 MED ORDER — BUSPIRONE HCL 5 MG PO TABS
5.0000 mg | ORAL_TABLET | Freq: Two times a day (BID) | ORAL | 1 refills | Status: AC
Start: 2022-11-19 — End: ?
  Filled 2022-11-19: qty 180, 45d supply, fill #0

## 2022-11-19 MED ORDER — AMITRIPTYLINE HCL 25 MG PO TABS
25.0000 mg | ORAL_TABLET | Freq: Every day | ORAL | 1 refills | Status: AC
  Filled 2022-11-19: qty 90, 90d supply, fill #0

## 2022-11-19 MED ORDER — NORETHIN ACE-ETH ESTRAD-FE 1.5-30 MG-MCG PO TABS
1.0000 | ORAL_TABLET | Freq: Every day | ORAL | 3 refills | Status: DC
  Filled 2022-11-19: qty 84, 84d supply, fill #0
  Filled 2023-02-08: qty 84, 84d supply, fill #1
  Filled 2023-04-28: qty 84, 84d supply, fill #2

## 2022-11-19 NOTE — Patient Instructions (Signed)
It was very nice to see you today!   I will review your lab results via MyChart in a few days.   Have a great weekend!    PLEASE NOTE:  If you had any lab tests please let us know if you have not heard back within a few days. You may see your results on MyChart before we have a chance to review them but we will give you a call once they are reviewed by us. If we ordered any referrals today, please let us know if you have not heard from their office within the next week.    

## 2022-11-19 NOTE — Progress Notes (Deleted)
Phone [phone]  Subjective:   Patient is a 32 y.o. female presenting for annual physical.    No chief complaint on file.   See problem oriented charting- ROS- full  review of systems was completed and negative except for *** noted in HPI above.  The following were reviewed and entered/updated in epic: Past Medical History:  Diagnosis Date   Environmental allergies    Patient Active Problem List   Diagnosis Date Noted   Interstitial cystitis 05/20/2022   Generalized anxiety disorder 05/20/2022   Gastroesophageal reflux disease without esophagitis 05/20/2022   Past Surgical History:  Procedure Laterality Date   WISDOM TOOTH EXTRACTION      Family History  Problem Relation Age of Onset   Healthy Mother    Healthy Father     Medications- reviewed and updated Current Outpatient Medications  Medication Sig Dispense Refill   amitriptyline (ELAVIL) 25 MG tablet Take 1 tablet (25 mg total) by mouth at bedtime. 90 tablet 0   busPIRone (BUSPAR) 5 MG tablet Take 1 tablet (5 mg) by mouth every morning for 1 week, THEN increase to 2 tablets every morning OR 1 tablet twice daily. 60 tablet 2   venlafaxine XR (EFFEXOR XR) 37.5 MG 24 hr capsule Take 1 capsule (37.5 mg total) by mouth daily with breakfast. 30 capsule 1   venlafaxine XR (EFFEXOR-XR) 37.5 MG 24 hr capsule Take 1 capsule (37.5 mg total) by mouth daily with breakfast. 30 capsule 0   No current facility-administered medications for this visit.    Allergies-reviewed and updated No Known Allergies  Social History   Social History Narrative   Not on file    Objective:  There were no vitals taken for this visit. Physical Exam Vitals and nursing note reviewed.  Constitutional:      Appearance: Normal appearance.  HENT:     Head: Normocephalic.     Right Ear: Tympanic membrane normal.     Left Ear: Tympanic membrane normal.     Nose: Nose normal.     Mouth/Throat:     Mouth: Mucous membranes are moist.   Eyes:     Pupils: Pupils are equal, round, and reactive to light.  Cardiovascular:     Rate and Rhythm: Normal rate and regular rhythm.  Pulmonary:     Effort: Pulmonary effort is normal.     Breath sounds: Normal breath sounds.  Musculoskeletal:        General: Normal range of motion.     Cervical back: Normal range of motion.  Lymphadenopathy:     Cervical: No cervical adenopathy.  Skin:    General: Skin is warm and dry.  Neurological:     Mental Status: She is alert.  Psychiatric:        Mood and Affect: Mood normal.        Behavior: Behavior normal.       Assessment and Plan   Health Maintenance counseling: 1. Anticipatory guidance: Patient counseled regarding regular dental exams q6 months, eye exams,  avoiding smoking and second hand smoke, limiting alcohol to 1 beverage per day, no illicit drugs.   2. Risk factor reduction:  Advised patient of need for regular exercise and diet rich with fruits and vegetables to reduce risk of heart attack and stroke. Wt Readings from Last 3 Encounters:  05/20/22 164 lb 2 oz (74.4 kg)  07/19/17 165 lb (74.8 kg)   3. Immunizations/screenings/ancillary studies Immunization History  Administered Date(s) Administered   DTaP 08/17/1991, 11/20/1991, 06/04/1992,  02/07/1993, 10/07/1993   Health Maintenance Due  Topic Date Due   DTaP/Tdap/Td (5 - Tdap) 05/29/2002   HIV Screening  Never done   Hepatitis C Screening  Never done   Cervical Cancer Screening (HPV/Pap Cotest)  Never done   INFLUENZA VACCINE  Never done   COVID-19 Vaccine (1 - 2023-24 season) Never done    4. Cervical cancer screening: *** 5. Skin cancer screening- advised regular sunscreen use. Denies worrisome, changing, or new skin lesions.  6. Birth control/STD check: *** 7. Smoking associated screening: *** smoker 8. Alcohol screening: ***  There are no diagnoses linked to this encounter.  Recommended follow up: ***No follow-ups on file. Future Appointments  Date  Time Provider Department Center  11/19/2022  9:20 AM Dulce Sellar, NP [facility]    Lab/Order associations:fasting    Dulce Sellar, NP

## 2022-11-19 NOTE — Assessment & Plan Note (Signed)
chronic used to take Elavil, stopped over a yr ago, sx were better now having more stress which is aggravating sx again restarted Elavil sending refill 25mg  qhs f/u 6mos or prn

## 2022-11-19 NOTE — Assessment & Plan Note (Addendum)
Chronic switched from Effexor to Buspar 5-10mg  bid pt states this is working better for her, does not take on w/e and feels different - advised med works better if at least one dose in her system daily sending refill f/u 6 month or prn

## 2022-11-19 NOTE — Progress Notes (Signed)
Patient ID: Erin Chapman, female    DOB: 23-Sep-1990, 32 y.o.   MRN: [id number]  Chief Complaint  Patient presents with   Toe Pain    Pt c/o corn on side of right foot. Present for months.   Contraception    Pt would like to discuss Birth control pills.    Anxiety    Medication refill   *Discussed the use of AI scribe software for clinical note transcription with the patient, who gave verbal consent to proceed.  History of Present Illness   The patient, with a history of anxiety, reports a significant improvement with Buspar. She has been experimenting with the dosage, initially taking two pills a day, then reducing to one pill twice a day. She notes that the medication helps her sleep and has a calming effect.  The patient also expresses concern about her menstrual cycle, which has become heavier and longer since she turned [age]. She suspects she may be anemic, as she has been told in the past that her blood was low. She has previously used the NuvaRing for birth control, but stopped due to a perceived negative impact on her sex drive.  In addition, the patient reports a painful condition on her right foot, on the inside with a small calloused area, which has been present for about a year. She has tried corn treatments with no success. The pain is particularly noticeable when wearing tennis shoes and when rolling her foot.      Assessment & Plan:   Anxiety - Improved with Buspar 5mg  twice daily. Discussed the flexibility of dosing and the importance of daily use for sustained effect. -Continue Buspar 5mg  twice daily, with the option to increase to 2 tablets bid if needed. -Send 90-day supply refill to [facility] outpatient.  Menstrual Irregularities - Reports heavier, longer, and more painful periods. Discussed hormonal fluctuations and the potential benefits of birth control. -Start Junel Fe with estradiol to regulate cycle and decrease bleeding. -Advise patient that it  may take 2-3 months for cycle to regulate.  Foot Pain - Reports persistent pain in right foot, possibly due to a corn or wart. Over-the-counter treatments have not been effective. -Refer to [facility] for evaluation and treatment.  General Health Maintenance -Administered flu shot today. -Schedule 52-month check-up.        Subjective:    Outpatient Medications Prior to Visit  Medication Sig Dispense Refill   amitriptyline (ELAVIL) 25 MG tablet Take 1 tablet (25 mg total) by mouth at bedtime. 90 tablet 0   busPIRone (BUSPAR) 5 MG tablet Take 1 tablet (5 mg) by mouth every morning for 1 week, THEN increase to 2 tablets every morning OR 1 tablet twice daily. 60 tablet 2   venlafaxine XR (EFFEXOR XR) 37.5 MG 24 hr capsule Take 1 capsule (37.5 mg total) by mouth daily with breakfast. (Patient not taking: Reported on 11/19/2022) 30 capsule 1   venlafaxine XR (EFFEXOR-XR) 37.5 MG 24 hr capsule Take 1 capsule (37.5 mg total) by mouth daily with breakfast. (Patient not taking: Reported on 11/19/2022) 30 capsule 0   No facility-administered medications prior to visit.   Past Medical History:  Diagnosis Date   Environmental allergies    Past Surgical History:  Procedure Laterality Date   WISDOM TOOTH EXTRACTION     No Known Allergies    Objective:    Physical Exam BP 128/84 (BP Location: Left Arm, Patient Position: Sitting, Cuff Size: Large)   Pulse  81   Temp (!) 97.3 F (36.3 C) (Temporal)   Ht 5\' 4"  (1.626 m)   Wt 173 lb 6 oz (78.6 kg)   LMP 11/19/2022 (Exact Date)   SpO2 100%   BMI 29.76 kg/m  Wt Readings from Last 3 Encounters:  11/19/22 173 lb 6 oz (78.6 kg)  05/20/22 164 lb 2 oz (74.4 kg)  07/19/17 165 lb (74.8 kg)       Dulce Sellar, NP

## 2022-11-22 ENCOUNTER — Ambulatory Visit: Payer: 59

## 2022-11-22 ENCOUNTER — Ambulatory Visit: Payer: 59 | Admitting: Podiatry

## 2022-11-22 ENCOUNTER — Encounter: Payer: Self-pay | Admitting: Podiatry

## 2022-11-22 DIAGNOSIS — D2371 Other benign neoplasm of skin of right lower limb, including hip: Secondary | ICD-10-CM | POA: Diagnosis not present

## 2022-11-22 DIAGNOSIS — M79671 Pain in right foot: Secondary | ICD-10-CM

## 2022-11-22 NOTE — Progress Notes (Signed)
   Chief Complaint  Patient presents with   Callouses    Painful IPK right heel, present for about 1 year    Subjective: 32 y.o. female presenting to the office today as a new patient for evaluation of a symptomatic skin lesion to the plantar medial aspect of the right heel.  Idiopathic onset about 1 year ago.  Over the last few months it has become increasingly symptomatic and painful.   Past Medical History:  Diagnosis Date   Environmental allergies     Past Surgical History:  Procedure Laterality Date   WISDOM TOOTH EXTRACTION      No Known Allergies   Objective:  Physical Exam General: Alert and oriented x3 in no acute distress  Dermatology: Hyperkeratotic lesion(s) present on the plantar medial aspect of the right heel. Pain on palpation with a central nucleated core noted. Skin is warm, dry and supple bilateral lower extremities. Negative for open lesions or macerations.  Vascular: Palpable pedal pulses bilaterally. No edema or erythema noted. Capillary refill within normal limits.  Neurological: Grossly intact via light touch  Musculoskeletal Exam: Pain on palpation at the keratotic lesion(s) noted. Range of motion within normal limits bilateral. Muscle strength 5/5 in all groups bilateral.  Assessment: 1.  Eccrine poroma right heel   Plan of Care:  -Patient evaluated -Excisional debridement of keratoic lesion(s) using a chisel blade was performed without incident.  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 Felecia Shelling, DPM    [address] [place], [place] [address]                Office [phone]  Fax [phone]

## 2022-12-01 ENCOUNTER — Other Ambulatory Visit: Payer: Self-pay

## 2022-12-08 ENCOUNTER — Encounter: Payer: Self-pay | Admitting: Family

## 2022-12-08 ENCOUNTER — Other Ambulatory Visit (HOSPITAL_COMMUNITY)
Admission: RE | Admit: 2022-12-08 | Discharge: 2022-12-08 | Disposition: A | Discharge status: Home / Self Care | Payer: 59 | Source: Ambulatory Visit | Attending: Family | Admitting: Family

## 2022-12-08 ENCOUNTER — Ambulatory Visit: Payer: 59 | Admitting: Family

## 2022-12-08 VITALS — BP 138/76 | HR 78 | Temp 97.0°F | Wt 171.6 lb

## 2022-12-08 DIAGNOSIS — R350 Frequency of micturition: Secondary | ICD-10-CM

## 2022-12-08 DIAGNOSIS — N9489 Other specified conditions associated with female genital organs and menstrual cycle: Secondary | ICD-10-CM | POA: Diagnosis not present

## 2022-12-08 LAB — POCT URINALYSIS DIPSTICK
Bilirubin, UA: NEGATIVE
Blood, UA: NEGATIVE
Glucose, UA: NEGATIVE
Ketones, UA: NEGATIVE
Leukocytes, UA: NEGATIVE
Nitrite, UA: NEGATIVE
Protein, UA: POSITIVE — AB
Spec Grav, UA: >=1.03 — AB (ref 1.010–1.025)
Urobilinogen, UA: 0.2 U/dL
pH, UA: 6 (ref 5.0–8.0)

## 2022-12-08 NOTE — Progress Notes (Signed)
Patient ID: Erin Chapman, female    DOB: 04-17-1990, 32 y.o.   MRN: [id number]  Chief Complaint  Patient presents with   Vaginal Itching    Itching and irritation started Sunday    *Discussed the use of AI scribe software for clinical note transcription with the patient, who gave verbal consent to proceed.  History of Present Illness   The patient presents with vaginal symptoms, unsure if she is due to a yeast infection or bacterial vaginosis (BV). She has a history of both conditions, with BV occurring more recently. She describes a musty smell, but not the foul odor typically associated with BV. She also reports itching, which she associates more with yeast infections. She has not tried any over-the-counter treatments due to a previous allergic reaction to Monistat, which caused swelling. She also reports frequent urination, up to five or six times a night, and a burning sensation when urinating. She has a history of interstitial cystitis (IC), but does not believe this is the cause of her current symptoms. She has made recent changes to her laundry detergent and body soap to hypoallergenic options in an attempt to alleviate her symptoms.        Assessment & Plan:     Vaginitis - Unclear etiology, with a history of both yeast infections and bacterial vaginosis (BV). Symptoms include discharge, odor, and irritation. Patient has a known allergy to Monistat. -Perform vaginal swab to determine the cause of vaginitis. -Results will guide treatment plan.  Possible Urinary Tract Infection (UTI) - Increased frequency of urination and burning sensation during urination. Patient has a history of interstitial cystitis (IC), which can present with similar symptoms. UA neg. Advised on continued hydration. -Perform urinalysis to rule out UTI.   Subjective:    Outpatient Medications Prior to Visit  Medication Sig Dispense Refill   amitriptyline (ELAVIL) 25 MG tablet Take 1 tablet (25 mg total)  by mouth at bedtime. 90 tablet 1   busPIRone (BUSPAR) 5 MG tablet Take 1-2 tablets (5-10 mg total) by mouth 2 (two) times daily. 180 tablet 1   norethindrone-ethinyl estradiol-iron (JUNEL FE 1.5/30) 1.5-30 MG-MCG tablet Take 1 tablet by mouth daily. 84 tablet 3   No facility-administered medications prior to visit.   Past Medical History:  Diagnosis Date   Environmental allergies    Past Surgical History:  Procedure Laterality Date   WISDOM TOOTH EXTRACTION     No Known Allergies    Objective:    Physical Exam Vitals and nursing note reviewed.  Constitutional:      Appearance: Normal appearance.  Cardiovascular:     Rate and Rhythm: Normal rate and regular rhythm.  Pulmonary:     Effort: Pulmonary effort is normal.     Breath sounds: Normal breath sounds.  Musculoskeletal:        General: Normal range of motion.  Skin:    General: Skin is warm and dry.  Neurological:     Mental Status: She is alert.  Psychiatric:        Mood and Affect: Mood normal.        Behavior: Behavior normal.   BP 138/76   Pulse 78   Temp (!) 97 F (36.1 C) (Temporal)   Wt 171 lb 9.6 oz (77.8 kg)   LMP 11/19/2022 (Exact Date)   SpO2 98%   BMI 29.46 kg/m  Wt Readings from Last 3 Encounters:  12/08/22 171 lb 9.6 oz (77.8 kg)  11/19/22 173 lb 6 oz (78.6  kg)  05/20/22 164 lb 2 oz (74.4 kg)      Dulce Sellar, NP

## 2022-12-09 LAB — CERVICOVAGINAL ANCILLARY ONLY
Bacterial Vaginitis (gardnerella): NEGATIVE
Candida Glabrata: NEGATIVE
Candida Vaginitis: NEGATIVE
Comment: NEGATIVE
Comment: NEGATIVE
Comment: NEGATIVE

## 2022-12-20 ENCOUNTER — Other Ambulatory Visit: Payer: Self-pay

## 2022-12-20 ENCOUNTER — Telehealth: Payer: Self-pay | Admitting: Family

## 2022-12-20 NOTE — Telephone Encounter (Signed)
Patient requests copy of Flu shot administered on 11/19/22 to be printed for Patient to pick up. Requests to be advised when ready.

## 2022-12-20 NOTE — Telephone Encounter (Signed)
LVM Copy of Flu Shot is ready for pick up

## 2022-12-22 NOTE — Telephone Encounter (Signed)
Patient states she pulled immunization records online.

## 2022-12-27 ENCOUNTER — Ambulatory Visit: Payer: 59 | Admitting: Podiatry

## 2023-02-08 ENCOUNTER — Other Ambulatory Visit: Payer: Self-pay

## 2023-02-24 ENCOUNTER — Other Ambulatory Visit (HOSPITAL_COMMUNITY): Payer: Self-pay

## 2023-02-24 MED ORDER — ACETAMINOPHEN-CODEINE 300-30 MG PO TABS
1.0000 | ORAL_TABLET | Freq: Four times a day (QID) | ORAL | 0 refills | Status: DC | PRN
  Filled 2023-02-24: qty 16, 4d supply, fill #0

## 2023-02-24 MED ORDER — IBUPROFEN 600 MG PO TABS
600.0000 mg | ORAL_TABLET | Freq: Four times a day (QID) | ORAL | 0 refills | Status: AC | PRN
  Filled 2023-02-24: qty 20, 5d supply, fill #0

## 2023-04-28 ENCOUNTER — Other Ambulatory Visit: Payer: Self-pay

## 2023-05-23 ENCOUNTER — Other Ambulatory Visit (HOSPITAL_COMMUNITY): Payer: Self-pay

## 2023-05-23 DIAGNOSIS — R300 Dysuria: Secondary | ICD-10-CM | POA: Diagnosis not present

## 2023-05-23 DIAGNOSIS — Z01419 Encounter for gynecological examination (general) (routine) without abnormal findings: Secondary | ICD-10-CM | POA: Diagnosis not present

## 2023-05-23 DIAGNOSIS — Z113 Encounter for screening for infections with a predominantly sexual mode of transmission: Secondary | ICD-10-CM | POA: Diagnosis not present

## 2023-05-23 DIAGNOSIS — N898 Other specified noninflammatory disorders of vagina: Secondary | ICD-10-CM | POA: Diagnosis not present

## 2023-05-23 DIAGNOSIS — Z124 Encounter for screening for malignant neoplasm of cervix: Secondary | ICD-10-CM | POA: Diagnosis not present

## 2023-05-23 DIAGNOSIS — L748 Other eccrine sweat disorders: Secondary | ICD-10-CM | POA: Diagnosis not present

## 2023-05-23 MED ORDER — BLISOVI 24 FE 1-20 MG-MCG(24) PO TABS
1.0000 | ORAL_TABLET | Freq: Every day | ORAL | 3 refills | Status: AC
  Filled 2023-05-23: qty 84, 84d supply, fill #0
  Filled 2023-10-19: qty 84, 84d supply, fill #1
  Filled 2024-01-06: qty 84, 84d supply, fill #2

## 2023-05-24 ENCOUNTER — Other Ambulatory Visit: Payer: Self-pay

## 2023-10-19 ENCOUNTER — Other Ambulatory Visit: Payer: Self-pay

## 2023-10-19 ENCOUNTER — Other Ambulatory Visit: Payer: Self-pay | Admitting: Family

## 2023-10-19 NOTE — Telephone Encounter (Signed)
 Copied from CRM [id number]. Topic: Clinical - Medication Refill >> Oct 19, 2023  9:12 AM Roselie BROCKS wrote: Medication:  Norethindrone  Acetate-Ethinyl Estrad-FE (BLISOVI  24 FE) 1-20 MG-MCG(24) table Has the patient contacted their pharmacy? No (Agent: If no, request that the patient contact the pharmacy for the refill. If patient does not wish to contact the pharmacy document the reason why and proceed with request.) (Agent: If yes, when and what did the pharmacy advise?)  This is the patient's preferred pharmacy:  [facility] [address] [place] [address] Phone: [phone] Fax: [phone]  Is this the correct pharmacy for this prescription? Yes If no, delete pharmacy and type the correct one.   Has the prescription been filled recently? No  Is the patient out of the medication? Yes  Has the patient been seen for an appointment in the last year OR does the patient have an upcoming appointment? Yes  Can we respond through MyChart? yes  Agent: Please be advised that Rx refills may take up to 3 business days. We ask that you follow-up with your pharmacy.

## 2023-11-14 ENCOUNTER — Encounter: Payer: Self-pay | Admitting: Podiatry

## 2023-11-14 ENCOUNTER — Ambulatory Visit (INDEPENDENT_AMBULATORY_CARE_PROVIDER_SITE_OTHER): Admitting: Podiatry

## 2023-11-14 VITALS — Ht 64.0 in | Wt 171.6 lb

## 2023-11-14 DIAGNOSIS — D2371 Other benign neoplasm of skin of right lower limb, including hip: Secondary | ICD-10-CM | POA: Diagnosis not present

## 2023-11-14 NOTE — Progress Notes (Signed)
   Chief Complaint  Patient presents with   Plantar Warts    Pt is here due to wart on the side of her right foot, she states that it keeps coming back and is becoming more painful to walk on.    Subjective: 33 y.o. female presenting to the office today for follow-up evaluation of a symptomatic skin lesion to the plantar medial aspect of the right heel.  Present for over 1 year.  She has applied salicylic acid with no improvement   Past Medical History:  Diagnosis Date   Environmental allergies     Past Surgical History:  Procedure Laterality Date   WISDOM TOOTH EXTRACTION      No Known Allergies   Objective:  Physical Exam General: Alert and oriented x3 in no acute distress  Dermatology: Hyperkeratotic lesion(s) present on the plantar medial aspect of the right heel. Pain on palpation with a central nucleated core noted. Skin is warm, dry and supple bilateral lower extremities. Negative for open lesions or macerations.  Vascular: Palpable pedal pulses bilaterally. No edema or erythema noted. Capillary refill within normal limits.  Neurological: Grossly intact via light touch  Musculoskeletal Exam: Pain on palpation at the keratotic lesion(s) noted. Range of motion within normal limits bilateral. Muscle strength 5/5 in all groups bilateral.  Assessment: 1.  Eccrine poroma right heel   Plan of Care:  -Patient evaluated -Excisional debridement of keratoic lesion(s) using a chisel blade was performed without incident.  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 Thresa EMERSON Sar, DPM    [address] [place], [place] [address]                Office [phone]  Fax [phone]

## 2023-11-30 ENCOUNTER — Other Ambulatory Visit (HOSPITAL_BASED_OUTPATIENT_CLINIC_OR_DEPARTMENT_OTHER): Payer: Self-pay

## 2023-11-30 MED ORDER — FLUZONE 0.5 ML IM SUSY
0.5000 mL | PREFILLED_SYRINGE | Freq: Once | INTRAMUSCULAR | 0 refills | Status: AC
  Filled 2023-11-30: qty 0.5, 1d supply, fill #0

## 2024-01-06 ENCOUNTER — Other Ambulatory Visit (HOSPITAL_COMMUNITY): Payer: Self-pay

## 2024-01-06 ENCOUNTER — Other Ambulatory Visit: Payer: Self-pay | Admitting: Family

## 2024-01-06 ENCOUNTER — Other Ambulatory Visit: Payer: Self-pay

## 2024-01-06 ENCOUNTER — Encounter: Payer: Self-pay | Admitting: Family

## 2024-01-06 DIAGNOSIS — N926 Irregular menstruation, unspecified: Secondary | ICD-10-CM

## 2024-01-06 MED ORDER — NORETHIN ACE-ETH ESTRAD-FE 1.5-30 MG-MCG PO TABS
1.0000 | ORAL_TABLET | Freq: Every day | ORAL | 3 refills | Status: AC
  Filled 2024-01-06: qty 84, 84d supply, fill #0
  Filled 2024-03-13: qty 84, 84d supply, fill #1

## 2024-01-10 ENCOUNTER — Other Ambulatory Visit: Payer: Self-pay

## 2024-01-10 ENCOUNTER — Other Ambulatory Visit (HOSPITAL_COMMUNITY): Payer: Self-pay

## 2024-01-10 ENCOUNTER — Telehealth (INDEPENDENT_AMBULATORY_CARE_PROVIDER_SITE_OTHER): Admitting: Family Medicine

## 2024-01-10 ENCOUNTER — Encounter: Payer: Self-pay | Admitting: Family Medicine

## 2024-01-10 DIAGNOSIS — N926 Irregular menstruation, unspecified: Secondary | ICD-10-CM

## 2024-01-10 DIAGNOSIS — N946 Dysmenorrhea, unspecified: Secondary | ICD-10-CM | POA: Diagnosis not present

## 2024-01-10 NOTE — Progress Notes (Signed)
 MyChart Video Visit Virtual Visit via Video Note   This visit type was conducted w/patient consent. This format is felt to be most appropriate for this patient at this time. Physical exam was limited by quality of the video and audio technology used for the visit. CMA was able to get the patient set up on a video visit.  Patient location: Work. Patient and provider in visit Provider location: Office  I discussed the limitations of evaluation and management by telemedicine and the availability of in person appointments. The patient expressed understanding and agreed to proceed.  Visit Date: 01/10/2024  Today's healthcare provider: Jenkins CHRISTELLA Carrel, MD     Subjective:    Patient ID: Erin Chapman, female    DOB: [birth year], 33 y.o.   MRN: [id number]  Chief Complaint  Patient presents with   Contraception    Pt has been bleeding since she she has been on the current one x3 months    Discussed the use of AI scribe software for clinical note transcription with the patient, who gave verbal consent to proceed.  History of Present Illness Erin Chapman is a 33 year old female who presents with continuous bleeding while on birth control for a year.  She has experienced continuous bleeding while on her current birth control for the past year. Initially, the bleeding was intermittent but has progressed to daily occurrences, necessitating the use of liners every day. She experiences sharp pains that accompany the spotting, especially when it becomes heavier.  There has been no improvement in her symptoms with the current regimen. She previously used the NuvaRing, which she liked, but suspects it may have contributed to increased yeast and bacterial infections due to her interstitial cystitis. More infections were noted when she returned to using the NuvaRing in her thirties.  She denies any pain with intercourse, as she has not been sexually active in the past year. She reports a  constant discharge or bleeding, but no unusual odor or itching. Her last Pap smear was in April, and she has not had an ultrasound to investigate her symptoms. She takes her birth control continuously, including the sugar pills, and experiences a regular period during the placebo week, which is lighter and shorter than before starting birth control.  Her primary reason for using birth control is to manage heavy periods and mood swings, not for contraception. She has not consulted her gynecologist recently as her gynecologist is leaving, and she is in search of a new one.    Past Medical History:  Diagnosis Date   Environmental allergies     Past Surgical History:  Procedure Laterality Date   WISDOM TOOTH EXTRACTION      Outpatient Medications Prior to Visit  Medication Sig Dispense Refill   acetaminophen -codeine  (TYLENOL  #3) 300-30 MG tablet Take 1 tablet by mouth 4 (four) times daily as needed for pain 16 tablet 0   amitriptyline  (ELAVIL ) 25 MG tablet Take 1 tablet (25 mg total) by mouth at bedtime. 90 tablet 1   busPIRone  (BUSPAR ) 5 MG tablet Take 1-2 tablets (5-10 mg total) by mouth 2 (two) times daily. 180 tablet 1   ibuprofen  (ADVIL ) 600 MG tablet Take 1 tablet (600 mg total) by mouth every 6 (six) hours as needed for pain 20 tablet 0   Norethindrone  Acetate-Ethinyl Estrad-FE (BLISOVI  24 FE) 1-20 MG-MCG(24) tablet Take 1 tablet by mouth daily. 84 tablet 3   norethindrone -ethinyl estradiol -iron (BLISOVI  FE 1.5/30) 1.5-30 MG-MCG tablet Take 1 tablet by mouth  daily. 84 tablet 3   No facility-administered medications prior to visit.    No Known Allergies      Objective:     Physical Exam  Vitals and nursing note reviewed.  Constitutional:      General:  is not in acute distress.    Appearance: Normal appearance.  HENT:     Head: Normocephalic.  Pulmonary:     Effort: No respiratory distress.  Skin:    General: Skin is dry.     Coloration: Skin is not pale.   Neurological:     Mental Status: Pt is alert and oriented to person, place, and time.  Psychiatric:        Mood and Affect: Mood normal.   There were no vitals taken for this visit.  Wt Readings from Last 3 Encounters:  11/14/23 171 lb 9.6 oz (77.8 kg)  12/08/22 171 lb 9.6 oz (77.8 kg)  11/19/22 173 lb 6 oz (78.6 kg)       Assessment & Plan:   Problem List Items Addressed This Visit   None   Assessment and Plan Assessment & Plan Abnormal uterine bleeding   She has experienced chronic abnormal uterine bleeding for one year, with daily spotting and sharp pains during heavier bleeding. There is no dyspareunia, unusual odor, or itching. Differential diagnosis includes fibroids or a need for a different contraceptive regimen.  A pelvic ultrasound is ordered to evaluate for fibroids or other uterine abnormalities. Will take the increased dose of NE/EE 1.5/30.  She is instructed to monitor bleeding and report if it persists after one month of therapy.       No orders of the defined types were placed in this encounter.   I discussed the assessment and treatment plan with the patient. The patient was provided an opportunity to ask questions and all were answered. The patient agreed with the plan and demonstrated an understanding of the instructions.   The patient was advised to call back or seek an in-person evaluation if the symptoms worsen or if the condition fails to improve as anticipated.  No follow-ups on file.  Jenkins CHRISTELLA Carrel, MD [facility] [phone] (phone) [phone] (fax)  [facility]

## 2024-03-13 ENCOUNTER — Other Ambulatory Visit (HOSPITAL_COMMUNITY): Payer: Self-pay

## 2024-03-13 ENCOUNTER — Other Ambulatory Visit: Payer: Self-pay

## 2024-03-14 ENCOUNTER — Other Ambulatory Visit: Payer: Self-pay

## 2024-03-15 ENCOUNTER — Other Ambulatory Visit: Payer: Self-pay

## 2024-03-29 ENCOUNTER — Ambulatory Visit: Admitting: Family

## 2024-03-29 ENCOUNTER — Encounter: Payer: Self-pay | Admitting: Family

## 2024-03-29 VITALS — BP 122/70 | HR 74 | Temp 97.1°F | Ht 64.0 in | Wt 171.0 lb

## 2024-03-29 DIAGNOSIS — N926 Irregular menstruation, unspecified: Secondary | ICD-10-CM

## 2024-03-29 DIAGNOSIS — L0591 Pilonidal cyst without abscess: Secondary | ICD-10-CM

## 2024-03-29 DIAGNOSIS — N946 Dysmenorrhea, unspecified: Secondary | ICD-10-CM

## 2024-03-29 NOTE — Progress Notes (Signed)
 "  Patient ID: Erin Chapman, female    DOB: [birth year], 34 y.o.   MRN: [id number]  Chief Complaint  Patient presents with   Pilonidal cyst    Present for 2 weeks.   Menstrual Problem    Pt c/o spotting with Birth control pills.   Discussed the use of AI scribe software for clinical note transcription with the patient, who gave verbal consent to proceed.  History of Present Illness Erin Chapman is a 34 year old female who presents with a recurrent cyst in the sacrococcygeal region and issues with menstrual cycle regulation.  She has a recurrent tender cyst in the middle of her upper gluteal cleft that has occurred before and usually resolves without treatment. It is currently painful when she sits and has not opened or drained. Her aunt, who is a engineer, civil (consulting), advised in-person evaluation because it may be internal. She has ongoing menstrual cycle irregularity while on hormonal medication. She previously switched from her usual medication to one prescribed by her OB GYN and developed daily spotting. After returning to her original medication, she had one normal cycle, then spotting recurred. She takes the medication consistently at about the same time each day and has cramping and brief pains like menstrual cramps, worse in the week before her period. She recently developed an internal hemorrhoid causing swelling and discomfort. She is increasing water intake and using Miralax to avoid constipation.  Assessment & Plan Pilonidal cyst Recurrent cyst in sacrococcygeal region, firm and tender. Denies hx of draining, has reduced in size in past. Differential includes bone involvement. - Ordered pelvic ultrasound with soft tissue evaluation.   Abnormal uterine bleeding on oral contraceptives Intermittent spotting and cramping on current oral contraceptive with 30mcg ethinyl estrogen, states when on the 20mcg she had more bleeding. Discussed alternative contraceptives, but she prefers oral  contraceptives. - Continue current oral contraceptive regimen. - Ordered pelvic ultrasound to evaluate underlying causes. - Discuss potential switch of OCP if spotting persists.  Hemorrhoid New onset internal hemorrhoid likely due to straining. Symptoms include swelling and discomfort. - Recommended over-the-counter generic Preparation H suppositories nightly for up to 1 week. Can use the cream for itching or pain at anus 2-3 times per day. - Advised ok to use small amount of vaseline to ease insertion of suppository. - Encouraged increased water intake and fiber supplementation. - Continue daily Miralax. - Suggested warm water sitz baths for pain relief. - Use OTC Tucks pads or generic for wiping. - Call back if not resolving.   Subjective:    Outpatient Medications Prior to Visit  Medication Sig Dispense Refill   acetaminophen -codeine  (TYLENOL  #3) 300-30 MG tablet Take 1 tablet by mouth 4 (four) times daily as needed for pain 16 tablet 0   amitriptyline  (ELAVIL ) 25 MG tablet Take 1 tablet (25 mg total) by mouth at bedtime. 90 tablet 1   ibuprofen  (ADVIL ) 600 MG tablet Take 1 tablet (600 mg total) by mouth every 6 (six) hours as needed for pain 20 tablet 0   Norethindrone  Acetate-Ethinyl Estrad-FE (BLISOVI  24 FE) 1-20 MG-MCG(24) tablet Take 1 tablet by mouth daily. 84 tablet 3   norethindrone -ethinyl estradiol -iron (BLISOVI  FE 1.5/30) 1.5-30 MG-MCG tablet Take 1 tablet by mouth daily. 84 tablet 3   busPIRone  (BUSPAR ) 5 MG tablet Take 1-2 tablets (5-10 mg total) by mouth 2 (two) times daily. 180 tablet 1   No facility-administered medications prior to visit.   Past Medical History:  Diagnosis Date   Environmental allergies  Past Surgical History:  Procedure Laterality Date   WISDOM TOOTH EXTRACTION     Allergies[1]    Objective:    Physical Exam Vitals and nursing note reviewed.  Constitutional:      Appearance: Normal appearance.  Cardiovascular:     Rate and Rhythm:  Normal rate and regular rhythm.  Pulmonary:     Effort: Pulmonary effort is normal.     Breath sounds: Normal breath sounds.  Genitourinary:    Rectum: Internal hemorrhoid present.     Comments: Very firm area of tenderness on buttock cleft, approx. 1.5cm, not raised, no erythema or opening noted. Musculoskeletal:        General: Normal range of motion.  Skin:    General: Skin is warm and dry.  Neurological:     Mental Status: She is alert.  Psychiatric:        Mood and Affect: Mood normal.        Behavior: Behavior normal.    BP 122/70 (BP Location: Left Arm, Patient Position: Sitting, Cuff Size: Normal)   Pulse 74   Temp (!) 97.1 F (36.2 C) (Temporal)   Wt 171 lb (77.6 kg)   SpO2 98%   BMI 29.35 kg/m  Wt Readings from Last 3 Encounters:  03/29/24 171 lb (77.6 kg)  11/14/23 171 lb 9.6 oz (77.8 kg)  12/08/22 171 lb 9.6 oz (77.8 kg)       Erin Ralls, NP     [1] No Known Allergies  "

## 2024-03-29 NOTE — Addendum Note (Signed)
 Addended by: Marcel Gary on: 03/29/2024 09:29 AM   Modules accepted: Orders

## 2024-04-03 ENCOUNTER — Other Ambulatory Visit

## 2024-04-11 ENCOUNTER — Other Ambulatory Visit
